# Patient Record
Sex: Female | Born: 1960 | Race: White | Hispanic: No | Marital: Married | Smoking: Never smoker
Health system: Southern US, Community
[De-identification: ages and names within clinical notes are randomized; demographics above are authoritative.]

## PROBLEM LIST (undated history)

## (undated) HISTORY — PX: LASER ABLATION/CAUTERIZATION OF ENDOMETRIAL IMPLANTS: SHX1951

---

## 2014-07-08 LAB — HM MAMMOGRAPHY

## 2014-10-28 LAB — HM PAP SMEAR: HM Pap smear: NEGATIVE

## 2015-09-16 DIAGNOSIS — Z1231 Encounter for screening mammogram for malignant neoplasm of breast: Secondary | ICD-10-CM | POA: Diagnosis not present

## 2015-10-07 DIAGNOSIS — K644 Residual hemorrhoidal skin tags: Secondary | ICD-10-CM | POA: Diagnosis not present

## 2015-10-07 DIAGNOSIS — D124 Benign neoplasm of descending colon: Secondary | ICD-10-CM | POA: Diagnosis not present

## 2015-10-07 DIAGNOSIS — D120 Benign neoplasm of cecum: Secondary | ICD-10-CM | POA: Diagnosis not present

## 2015-10-07 DIAGNOSIS — Z1211 Encounter for screening for malignant neoplasm of colon: Secondary | ICD-10-CM | POA: Diagnosis not present

## 2015-10-07 DIAGNOSIS — Z86010 Personal history of colonic polyps: Secondary | ICD-10-CM | POA: Diagnosis not present

## 2015-10-07 DIAGNOSIS — K5730 Diverticulosis of large intestine without perforation or abscess without bleeding: Secondary | ICD-10-CM | POA: Diagnosis not present

## 2016-04-26 ENCOUNTER — Ambulatory Visit (INDEPENDENT_AMBULATORY_CARE_PROVIDER_SITE_OTHER): Payer: BLUE CROSS/BLUE SHIELD | Admitting: Osteopathic Medicine

## 2016-04-26 ENCOUNTER — Encounter: Payer: Self-pay | Admitting: Osteopathic Medicine

## 2016-04-26 VITALS — BP 126/72 | HR 85 | Ht 68.0 in | Wt 173.0 lb

## 2016-04-26 DIAGNOSIS — R21 Rash and other nonspecific skin eruption: Secondary | ICD-10-CM

## 2016-04-26 DIAGNOSIS — Z0000 Encounter for general adult medical examination without abnormal findings: Secondary | ICD-10-CM | POA: Insufficient documentation

## 2016-04-26 DIAGNOSIS — E7410 Disorder of fructose metabolism, unspecified: Secondary | ICD-10-CM

## 2016-04-26 DIAGNOSIS — Z86010 Personal history of colonic polyps: Secondary | ICD-10-CM | POA: Insufficient documentation

## 2016-04-26 DIAGNOSIS — J302 Other seasonal allergic rhinitis: Secondary | ICD-10-CM | POA: Insufficient documentation

## 2016-04-26 DIAGNOSIS — Z23 Encounter for immunization: Secondary | ICD-10-CM | POA: Diagnosis not present

## 2016-04-26 DIAGNOSIS — B029 Zoster without complications: Secondary | ICD-10-CM

## 2016-04-26 MED ORDER — ZOSTER VACCINE LIVE 19400 UNT/0.65ML ~~LOC~~ SUSR: 0.6500 mL | Freq: Once | SUBCUTANEOUS | 0 refills | Status: AC

## 2016-04-26 NOTE — Progress Notes (Signed)
HPI: Lynn Gamble is a 55 y.o. female  who [facility] today, 04/26/16,  for chief complaint of:  Chief Complaint  Patient presents with  . Establish Care    rash on side of right breast    Rash . Context:  . Location: R breast on the side. Near bra line . Quality: started as small blisters, painful, nerve pain  . Duration: 2 weeks ago started, getting better  . Modifying factors: patches and antibiotic ointment   See below for review of preventive care.   Past medical, surgical, social and family history reviewed: There are no active problems to display for this patient.  No past surgical history on file. Social History  Substance Use Topics  . Smoking status: Never Smoker  . Smokeless tobacco: Never Used  . Alcohol use Not on file   No family history on file.   Current medication list and allergy/intolerance information reviewed:   Current Outpatient Prescriptions  Medication Sig Dispense Refill  . cetirizine (ZYRTEC) 10 MG tablet Take 10 mg by mouth daily.    . Multiple Vitamin (MULTIVITAMIN) tablet Take 1 tablet by mouth daily.     No current facility-administered medications for this visit.    Allergies  Allergen Reactions  . Fructose Diarrhea      Review of Systems:  Constitutional:  No  fever, no chills, No recent illness, No unintentional weight changes. No significant fatigue.   HEENT: No  headache, no vision change, no hearing change, No sore throat, No  sinus pressure  Cardiac: No  chest pain, No  pressure, No palpitations, No  Orthopnea  Respiratory:  No  shortness of breath. No  Cough  Gastrointestinal: No  abdominal pain, No  nausea, No  vomiting,  No  blood in stool, No  diarrhea, No  constipation   Musculoskeletal: No new myalgia/arthralgia  Genitourinary: No  incontinence, No  abnormal genital bleeding, No abnormal genital discharge  Skin: +Rash, No other wounds/concerning lesions  Hem/Onc:  No  easy bruising/bleeding, No  abnormal lymph node  Endocrine: No cold intolerance,  No heat intolerance. No polyuria/polydipsia/polyphagia   Neurologic: No  weakness, No  dizziness, No  slurred speech/focal weakness/facial droop  Psychiatric: No  concerns with depression, No  concerns with anxiety, No sleep problems, No mood problems  Exam:  BP 126/72   Pulse 85   Ht 5\' 8"  (1.727 m)   Wt 173 lb (78.5 kg)   BMI 26.30 kg/m   Constitutional: VS see above. General Appearance: alert, well-developed, well-nourished, NAD  Eyes: Normal lids and conjunctive, non-icteric sclera  Ears, Nose, Mouth, Throat: MMM, Normal external inspection ears/nares/mouth/lips/gums. TM normal bilaterally. Pharynx/tonsils no erythema, no exudate. Nasal mucosa normal.   Neck: No masses, trachea midline. No thyroid enlargement. No tenderness/mass appreciated. No lymphadenopathy  Respiratory: Normal respiratory effort. no wheeze, no rhonchi, no rales  Cardiovascular: S1/S2 normal, no murmur, no rub/gallop auscultated. RRR. No lower extremity edema.  Gastrointestinal: Nontender, no masses. No hepatomegaly, no splenomegaly. No hernia appreciated. Bowel sounds normal. Rectal exam deferred.   Musculoskeletal: Gait normal. No clubbing/cyanosis of digits.   Neurological: Normal balance/coordination. No tremor. No cranial nerve deficit on limited exam. Motor and sensation intact and symmetric. Cerebellar reflexes intact.   Skin: warm, dry, intact. Healing shingles type rash inferior lateral to right breast. No concerning nevi or subq nodules on limited exam.    Psychiatric: Normal judgment/insight. Normal mood and affect. Oriented x3.   BREAST: No  rashes/skin changes, normal fibrous breast tissue, no masses or tenderness, normal nipple without discharge, normal axilla    ASSESSMENT/PLAN:   Annual physical exam - Plan: CBC with Differential/Platelet, Lipid panel, COMPLETE METABOLIC PANEL WITH GFR, TSH, VITAMIN  D 25 Hydroxy (Vit-D Deficiency, Fractures), Zoster Vaccine Live, PF, (ZOSTAVAX) [id number] UNT/0.65ML injection  Need for tetanus booster  Fructose intolerance  Chronic seasonal allergic rhinitis, unspecified trigger  History of colonic polyps - colonoscopy 10/2015, awaiting records  Rash and nonspecific skin eruption  Herpes zoster without complication    FEMALE PREVENTIVE CARE Updated 04/26/16   ANNUAL SCREENING/COUNSELING  Diet/Exercise - HEALTHY HABITS DISCUSSED TO DECREASE CV RISK - fructose intolerance, admits to some noncompliance issues, previously following with GI, walks in treadmill few times per week History  Smoking Status  . Never Smoker  Smokeless Tobacco  . Never Used   History  Alcohol use Not on file   [facility] 04/[facility]/2016  Decreased Interest 0  Down, Depressed, Hopeless 0  PHQ - 2 Score 0    Domestic violence concerns - no  HTN SCREENING - SEE VITALS  SEXUAL HEALTH  Sexually active in the past year - Yes with female.  Need/want STI testing today? - no  Concerns about libido or pain with sex? - no  Plans for pregnancy? - postmenopusal  INFECTIOUS DISEASE SCREENING  HIV - does not need  GC/CT - does not need  HepC - DOB [birth year] - does not need  TB - does not need  DISEASE SCREENING  Lipid - needs  DM2 - needs  Osteoporosis - women age 79+ - does not need  CANCER SCREENING  Cervical - does not need, OBGYN visit 10/2015, hx abnormal pap x1, no procedures were needed   Breast - does not need, done this earlier this year 06/2015  Lung - does not need  Colon - does not need, recent colonoscopy 10/2015 HX precancerous polyps.   ADULT VACCINATION  Influenza - annual vaccine recommended, done thisyear  Td - booster every 10 years - done 04/26/16   Zoster - option at 4350, yes at 4460+ -   PCV13 - was not indicated  PPSV23 - was not indicated     Visit summary with medication list and pertinent instructions was  printed for patient to review. All questions at time of visit were answered - patient instructed to contact office with any additional concerns. ER/RTC precautions were reviewed with the patient. Follow-up plan: Return in about 1 year (around 04/26/2017) [facility] or sooner if needed.

## 2016-04-28 DIAGNOSIS — J302 Other seasonal allergic rhinitis: Secondary | ICD-10-CM | POA: Diagnosis not present

## 2016-04-28 DIAGNOSIS — Z23 Encounter for immunization: Secondary | ICD-10-CM | POA: Diagnosis not present

## 2016-04-28 LAB — LIPID PANEL
CHOL/HDL RATIO: 3.9 ratio (ref <5.0)
CHOLESTEROL: 193 mg/dL (ref <200)
HDL: 49 mg/dL — ABNORMAL LOW (ref >50)
LDL Cholesterol: 121 mg/dL — ABNORMAL HIGH (ref <100)
Triglycerides: 117 mg/dL (ref <150)
VLDL: 23 mg/dL (ref <30)

## 2016-04-28 LAB — CBC WITH DIFFERENTIAL/PLATELET
BASOS ABS: 0 {cells}/uL (ref 0–200)
Basophils Relative: 0 %
EOS ABS: 290 {cells}/uL (ref 15–500)
EOS PCT: 5 %
HCT: 41.4 % (ref 35.0–45.0)
Hemoglobin: 14.1 g/dL (ref 11.7–15.5)
Lymphocytes Relative: 31 %
Lymphs Abs: 1798 cells/uL (ref 850–3900)
MCH: 29.2 pg (ref 27.0–33.0)
MCHC: 34.1 g/dL (ref 32.0–36.0)
MCV: 85.7 fL (ref 80.0–100.0)
MONOS PCT: 8 %
MPV: 10.3 fL (ref 7.5–12.5)
Monocytes Absolute: 464 cells/uL (ref 200–950)
NEUTROS ABS: 3248 {cells}/uL (ref 1500–7800)
NEUTROS PCT: 56 %
PLATELETS: 293 10*3/uL (ref 140–400)
RBC: 4.83 MIL/uL (ref 3.80–5.10)
RDW: 13.5 % (ref 11.0–15.0)
WBC: 5.8 10*3/uL (ref 3.8–10.8)

## 2016-04-28 LAB — COMPLETE METABOLIC PANEL WITH GFR
ALBUMIN: 4.4 g/dL (ref 3.6–5.1)
ALT: 16 U/L (ref 6–29)
AST: 16 U/L (ref 10–35)
Alkaline Phosphatase: 72 U/L (ref 33–130)
BUN: 10 mg/dL (ref 7–25)
CHLORIDE: 104 mmol/L (ref 98–110)
CO2: 28 mmol/L (ref 20–31)
CREATININE: 0.79 mg/dL (ref 0.50–1.05)
Calcium: 9.7 mg/dL (ref 8.6–10.4)
GFR, Est African American: >89 mL/min (ref >=60)
GFR, Est Non African American: 86 mL/min (ref >=60)
GLUCOSE: 93 mg/dL (ref 65–99)
POTASSIUM: 4.2 mmol/L (ref 3.5–5.3)
SODIUM: 140 mmol/L (ref 135–146)
Total Bilirubin: 0.5 mg/dL (ref 0.2–1.2)
Total Protein: 7.2 g/dL (ref 6.1–8.1)

## 2016-04-28 LAB — TSH: TSH: 2.37 mIU/L

## 2016-04-28 NOTE — Addendum Note (Signed)
Addended by: Pixie CasinoUNNINGHAM, RHONDA C on: 04/28/2016 09:44 AM   Modules accepted: Orders

## 2016-04-29 LAB — VITAMIN D 25 HYDROXY (VIT D DEFICIENCY, FRACTURES): Vit D, 25-Hydroxy: 29 ng/mL — ABNORMAL LOW (ref 30–100)

## 2016-05-20 ENCOUNTER — Encounter: Payer: Self-pay | Admitting: Osteopathic Medicine

## 2016-07-26 ENCOUNTER — Telehealth: Payer: Self-pay

## 2016-07-26 NOTE — Telephone Encounter (Signed)
Dr. Selena Battenody pt's previous doctor called and stated that the pt has really bad allergies and is not feeling well. He states [place] Dr. Selena Battenody states that when she was under his care they were treating her allergies by giving her kenalog 40 mg injections every 3 to 4 months. Dr. Selena Battenody wants to know if that is something you could consider for her. Dr. Denyse Amassorey stated that you can reach him at [phone] or cell [phone]. Please advise?

## 2016-07-27 NOTE — Telephone Encounter (Signed)
Ok to continue injections if patient would like, ror we can refer to allergist - can come in for nurse visit for injection. Would be helpful to have records though if we are going to continue this treatment - can we please have Dr Selena Battenody send records, make sure pt has signed release? Thakns.

## 2016-07-27 NOTE — Telephone Encounter (Signed)
Pt informed. Pt is going to get Dr. Selena Battenody to fax her health records to the office. Pt stated she does want injections.

## 2016-08-04 ENCOUNTER — Telehealth: Payer: Self-pay | Admitting: Osteopathic Medicine

## 2016-08-04 NOTE — Telephone Encounter (Signed)
SPOKE TO PATIENT GAVE HER RESULTS AS NOTED BELOW. Lynn Gamble,CMA  

## 2016-08-04 NOTE — Telephone Encounter (Signed)
Please call patient: form filled out and left up front for her to pick up.

## 2016-09-23 ENCOUNTER — Telehealth: Payer: Self-pay

## 2016-09-23 NOTE — Telephone Encounter (Signed)
Patient called inquiring about records from previous provider about allergy shot. Patient was given direct fax number and advised to have them to fax me directly so that I can put it in the provider basket. Jeena Arnett,CMA

## 2016-10-24 DIAGNOSIS — Z1231 Encounter for screening mammogram for malignant neoplasm of breast: Secondary | ICD-10-CM | POA: Diagnosis not present

## 2016-10-24 LAB — HM MAMMOGRAPHY

## 2016-10-28 ENCOUNTER — Encounter: Payer: Self-pay | Admitting: Osteopathic Medicine

## 2017-03-15 ENCOUNTER — Ambulatory Visit (INDEPENDENT_AMBULATORY_CARE_PROVIDER_SITE_OTHER): Payer: BLUE CROSS/BLUE SHIELD | Admitting: Osteopathic Medicine

## 2017-03-15 DIAGNOSIS — Z23 Encounter for immunization: Secondary | ICD-10-CM

## 2017-05-02 ENCOUNTER — Ambulatory Visit (INDEPENDENT_AMBULATORY_CARE_PROVIDER_SITE_OTHER): Payer: BLUE CROSS/BLUE SHIELD | Admitting: Family Medicine

## 2017-05-02 ENCOUNTER — Encounter: Payer: Self-pay | Admitting: Family Medicine

## 2017-05-02 VITALS — BP 125/82 | HR 90 | Temp 98.2°F | Ht 68.0 in | Wt 164.0 lb

## 2017-05-02 DIAGNOSIS — J069 Acute upper respiratory infection, unspecified: Secondary | ICD-10-CM

## 2017-05-02 DIAGNOSIS — J040 Acute laryngitis: Secondary | ICD-10-CM | POA: Diagnosis not present

## 2017-05-02 MED ORDER — AZITHROMYCIN 250 MG PO TABS: ORAL_TABLET | ORAL | 0 refills | Status: AC

## 2017-05-02 NOTE — Progress Notes (Signed)
   Subjective:    Patient ID: Lynn PoleLinda Gamble, female    DOB: 06/18/1960, 56 y.o.   MRN: [id number]  HPI   56 year old female comes in complaining of upper respiratory symptoms.  She actually had a sinus head cold and headache about 2 weeks ago.  She gradually got better but then 2 days ago started experiencing sore throat and left ear pain and then woke up with a raspy hoarse voice and starting to cough.  She is mostly just been using Mucinex.     Review of Systems     Objective:   Physical Exam  Constitutional: She is oriented to person, place, and time. She appears well-developed and well-nourished.  HENT:  Head: Normocephalic and atraumatic.  Right Ear: External ear normal.  Left Ear: External ear normal.  Nose: Nose normal.  Mouth/Throat: Oropharynx is clear and moist.  TMs and canals are clear.   Eyes: Conjunctivae and EOM are normal. Pupils are equal, round, and reactive to light.  Neck: Neck supple. No thyromegaly present.  Cardiovascular: Normal rate, regular rhythm and normal heart sounds.  Pulmonary/Chest: Effort normal and breath sounds normal. She has no wheezes.  Lymphadenopathy:    She has no cervical adenopathy.  Neurological: She is alert and oriented to person, place, and time.  Skin: Skin is warm and dry.  Psychiatric: She has a normal mood and affect.        Assessment & Plan:  Upper respiratory infection with laryngitis-explained that is likely viral.  I do not think she has secondary sickening from the original illness I think this is just a separate viral illness.  But she is also getting ready to leave town this week to visit her mother-in-law who is on hospice and stay with them for about a week.  Thus I went ahead and gave her a prescription for azithromycin to fill if she is getting worse or if she is just not improving.  Recommend symptomatic treatment with Mucinex, ibuprofen for fever, salt water gargles and maybe even consider zinc lozenges which have  shown to shorten the duration of the illness.

## 2017-06-02 DIAGNOSIS — Z6825 Body mass index (BMI) 25.0-25.9, adult: Secondary | ICD-10-CM | POA: Diagnosis not present

## 2017-06-02 DIAGNOSIS — Z0000 Encounter for general adult medical examination without abnormal findings: Secondary | ICD-10-CM | POA: Diagnosis not present

## 2017-06-02 DIAGNOSIS — Z01419 Encounter for gynecological examination (general) (routine) without abnormal findings: Secondary | ICD-10-CM | POA: Diagnosis not present

## 2017-08-17 ENCOUNTER — Telehealth: Payer: Self-pay

## 2017-08-17 NOTE — Telephone Encounter (Signed)
Left VM for Pt to return clinic call. Callback information provided.

## 2017-08-17 NOTE — Telephone Encounter (Signed)
What was the reason she was getting kenalog? Were these joint injections? Needs visit anyway prior to any Rx, hasn't been seen in some time.

## 2017-08-17 NOTE — Telephone Encounter (Signed)
Pt called requesting if her previous medical records were rec'd by pcp. As per pt, she was receiving kenalog 40 mg injections. Requesting feed back from pcp whether she can get kenalog injections in the office. Thanks.

## 2017-08-22 NOTE — Telephone Encounter (Signed)
Per pt - as a treatment from her ENT provider, she was getting injections of Kenalog 40 mg for severe allergies/chronic rhinitis/sinusitis. Pt states that injections have improved these symptoms. If approved by pcp - she will make an appt for nurse visit to get Kenalog injection.

## 2017-08-30 NOTE — Telephone Encounter (Signed)
Left VM for Pt to return clinic call regarding recommendation, callback info provided.

## 2017-08-30 NOTE — Telephone Encounter (Signed)
Either way, if it's been more than a year since she has had an appoint with me, she really needs a follow-up for chronic medical issues as her allergies seem to require medical attention... Transfer patient to scheduling, I am not going to just order a nurse visit for allergy injections which are not first-line therapy without discussing further with the patient in a face-to-face visit

## 2017-10-06 DIAGNOSIS — H43812 Vitreous degeneration, left eye: Secondary | ICD-10-CM | POA: Diagnosis not present

## 2017-11-13 DIAGNOSIS — H43812 Vitreous degeneration, left eye: Secondary | ICD-10-CM | POA: Diagnosis not present

## 2018-01-18 DIAGNOSIS — Z1231 Encounter for screening mammogram for malignant neoplasm of breast: Secondary | ICD-10-CM | POA: Diagnosis not present

## 2018-01-18 LAB — HM MAMMOGRAPHY

## 2018-01-25 ENCOUNTER — Encounter: Payer: Self-pay | Admitting: Osteopathic Medicine

## 2018-01-25 ENCOUNTER — Telehealth: Payer: Self-pay | Admitting: Osteopathic Medicine

## 2018-01-25 NOTE — Telephone Encounter (Signed)
Pt advised of results, verbalized understanding. No further questions.  

## 2018-01-25 NOTE — Telephone Encounter (Signed)
Please call patient: I got her mammogram report.  There was no problem, plan to repeat in 1 year.

## 2018-03-15 DIAGNOSIS — Z23 Encounter for immunization: Secondary | ICD-10-CM | POA: Diagnosis not present

## 2018-05-09 DIAGNOSIS — R309 Painful micturition, unspecified: Secondary | ICD-10-CM | POA: Diagnosis not present

## 2018-05-09 DIAGNOSIS — R300 Dysuria: Secondary | ICD-10-CM | POA: Diagnosis not present

## 2018-06-04 DIAGNOSIS — M9905 Segmental and somatic dysfunction of pelvic region: Secondary | ICD-10-CM | POA: Diagnosis not present

## 2018-06-04 DIAGNOSIS — M25551 Pain in right hip: Secondary | ICD-10-CM | POA: Diagnosis not present

## 2018-06-04 DIAGNOSIS — M5416 Radiculopathy, lumbar region: Secondary | ICD-10-CM | POA: Diagnosis not present

## 2018-06-04 DIAGNOSIS — M9903 Segmental and somatic dysfunction of lumbar region: Secondary | ICD-10-CM | POA: Diagnosis not present

## 2018-06-05 DIAGNOSIS — M9903 Segmental and somatic dysfunction of lumbar region: Secondary | ICD-10-CM | POA: Diagnosis not present

## 2018-06-05 DIAGNOSIS — M9905 Segmental and somatic dysfunction of pelvic region: Secondary | ICD-10-CM | POA: Diagnosis not present

## 2018-06-05 DIAGNOSIS — M25551 Pain in right hip: Secondary | ICD-10-CM | POA: Diagnosis not present

## 2018-06-05 DIAGNOSIS — M5416 Radiculopathy, lumbar region: Secondary | ICD-10-CM | POA: Diagnosis not present

## 2018-06-06 DIAGNOSIS — M5416 Radiculopathy, lumbar region: Secondary | ICD-10-CM | POA: Diagnosis not present

## 2018-06-06 DIAGNOSIS — M9905 Segmental and somatic dysfunction of pelvic region: Secondary | ICD-10-CM | POA: Diagnosis not present

## 2018-06-06 DIAGNOSIS — M25551 Pain in right hip: Secondary | ICD-10-CM | POA: Diagnosis not present

## 2018-06-06 DIAGNOSIS — M9903 Segmental and somatic dysfunction of lumbar region: Secondary | ICD-10-CM | POA: Diagnosis not present

## 2018-06-08 DIAGNOSIS — M25551 Pain in right hip: Secondary | ICD-10-CM | POA: Diagnosis not present

## 2018-06-08 DIAGNOSIS — M5416 Radiculopathy, lumbar region: Secondary | ICD-10-CM | POA: Diagnosis not present

## 2018-06-08 DIAGNOSIS — M9903 Segmental and somatic dysfunction of lumbar region: Secondary | ICD-10-CM | POA: Diagnosis not present

## 2018-06-08 DIAGNOSIS — M9905 Segmental and somatic dysfunction of pelvic region: Secondary | ICD-10-CM | POA: Diagnosis not present

## 2018-06-09 DIAGNOSIS — M9902 Segmental and somatic dysfunction of thoracic region: Secondary | ICD-10-CM | POA: Diagnosis not present

## 2018-06-09 DIAGNOSIS — M461 Sacroiliitis, not elsewhere classified: Secondary | ICD-10-CM | POA: Diagnosis not present

## 2018-06-09 DIAGNOSIS — M9903 Segmental and somatic dysfunction of lumbar region: Secondary | ICD-10-CM | POA: Diagnosis not present

## 2018-06-09 DIAGNOSIS — M9905 Segmental and somatic dysfunction of pelvic region: Secondary | ICD-10-CM | POA: Diagnosis not present

## 2018-06-09 DIAGNOSIS — M5416 Radiculopathy, lumbar region: Secondary | ICD-10-CM | POA: Diagnosis not present

## 2018-06-09 DIAGNOSIS — M25551 Pain in right hip: Secondary | ICD-10-CM | POA: Diagnosis not present

## 2018-06-09 DIAGNOSIS — M9904 Segmental and somatic dysfunction of sacral region: Secondary | ICD-10-CM | POA: Diagnosis not present

## 2018-06-11 DIAGNOSIS — M5416 Radiculopathy, lumbar region: Secondary | ICD-10-CM | POA: Diagnosis not present

## 2018-06-11 DIAGNOSIS — M9903 Segmental and somatic dysfunction of lumbar region: Secondary | ICD-10-CM | POA: Diagnosis not present

## 2018-06-11 DIAGNOSIS — M25551 Pain in right hip: Secondary | ICD-10-CM | POA: Diagnosis not present

## 2018-06-11 DIAGNOSIS — M9905 Segmental and somatic dysfunction of pelvic region: Secondary | ICD-10-CM | POA: Diagnosis not present

## 2018-06-12 ENCOUNTER — Ambulatory Visit (INDEPENDENT_AMBULATORY_CARE_PROVIDER_SITE_OTHER): Payer: BLUE CROSS/BLUE SHIELD

## 2018-06-12 ENCOUNTER — Other Ambulatory Visit: Payer: Self-pay | Admitting: Chiropractic Medicine

## 2018-06-12 DIAGNOSIS — M545 Low back pain, unspecified: Secondary | ICD-10-CM

## 2018-06-12 DIAGNOSIS — M48061 Spinal stenosis, lumbar region without neurogenic claudication: Secondary | ICD-10-CM

## 2018-06-13 DIAGNOSIS — M25551 Pain in right hip: Secondary | ICD-10-CM | POA: Diagnosis not present

## 2018-06-13 DIAGNOSIS — M5416 Radiculopathy, lumbar region: Secondary | ICD-10-CM | POA: Diagnosis not present

## 2018-06-13 DIAGNOSIS — M9905 Segmental and somatic dysfunction of pelvic region: Secondary | ICD-10-CM | POA: Diagnosis not present

## 2018-06-13 DIAGNOSIS — M9903 Segmental and somatic dysfunction of lumbar region: Secondary | ICD-10-CM | POA: Diagnosis not present

## 2018-06-13 DIAGNOSIS — M545 Low back pain: Secondary | ICD-10-CM | POA: Diagnosis not present

## 2018-06-15 DIAGNOSIS — M9903 Segmental and somatic dysfunction of lumbar region: Secondary | ICD-10-CM | POA: Diagnosis not present

## 2018-06-15 DIAGNOSIS — M9905 Segmental and somatic dysfunction of pelvic region: Secondary | ICD-10-CM | POA: Diagnosis not present

## 2018-06-15 DIAGNOSIS — M5416 Radiculopathy, lumbar region: Secondary | ICD-10-CM | POA: Diagnosis not present

## 2018-06-15 DIAGNOSIS — M25551 Pain in right hip: Secondary | ICD-10-CM | POA: Diagnosis not present

## 2018-06-16 DIAGNOSIS — M25551 Pain in right hip: Secondary | ICD-10-CM | POA: Diagnosis not present

## 2018-06-16 DIAGNOSIS — M5416 Radiculopathy, lumbar region: Secondary | ICD-10-CM | POA: Diagnosis not present

## 2018-06-16 DIAGNOSIS — M9903 Segmental and somatic dysfunction of lumbar region: Secondary | ICD-10-CM | POA: Diagnosis not present

## 2018-06-16 DIAGNOSIS — M9905 Segmental and somatic dysfunction of pelvic region: Secondary | ICD-10-CM | POA: Diagnosis not present

## 2018-06-18 DIAGNOSIS — M5416 Radiculopathy, lumbar region: Secondary | ICD-10-CM | POA: Diagnosis not present

## 2018-06-18 DIAGNOSIS — M9905 Segmental and somatic dysfunction of pelvic region: Secondary | ICD-10-CM | POA: Diagnosis not present

## 2018-06-18 DIAGNOSIS — M9903 Segmental and somatic dysfunction of lumbar region: Secondary | ICD-10-CM | POA: Diagnosis not present

## 2018-06-18 DIAGNOSIS — M25551 Pain in right hip: Secondary | ICD-10-CM | POA: Diagnosis not present

## 2018-06-19 DIAGNOSIS — M25551 Pain in right hip: Secondary | ICD-10-CM | POA: Diagnosis not present

## 2018-06-19 DIAGNOSIS — M9905 Segmental and somatic dysfunction of pelvic region: Secondary | ICD-10-CM | POA: Diagnosis not present

## 2018-06-19 DIAGNOSIS — M5416 Radiculopathy, lumbar region: Secondary | ICD-10-CM | POA: Diagnosis not present

## 2018-06-19 DIAGNOSIS — M9903 Segmental and somatic dysfunction of lumbar region: Secondary | ICD-10-CM | POA: Diagnosis not present

## 2018-06-20 DIAGNOSIS — M25551 Pain in right hip: Secondary | ICD-10-CM | POA: Diagnosis not present

## 2018-06-20 DIAGNOSIS — M5416 Radiculopathy, lumbar region: Secondary | ICD-10-CM | POA: Diagnosis not present

## 2018-06-20 DIAGNOSIS — M9905 Segmental and somatic dysfunction of pelvic region: Secondary | ICD-10-CM | POA: Diagnosis not present

## 2018-06-20 DIAGNOSIS — M9903 Segmental and somatic dysfunction of lumbar region: Secondary | ICD-10-CM | POA: Diagnosis not present

## 2018-06-22 DIAGNOSIS — Z0000 Encounter for general adult medical examination without abnormal findings: Secondary | ICD-10-CM | POA: Diagnosis not present

## 2018-06-22 DIAGNOSIS — M9905 Segmental and somatic dysfunction of pelvic region: Secondary | ICD-10-CM | POA: Diagnosis not present

## 2018-06-22 DIAGNOSIS — M5416 Radiculopathy, lumbar region: Secondary | ICD-10-CM | POA: Diagnosis not present

## 2018-06-22 DIAGNOSIS — M25551 Pain in right hip: Secondary | ICD-10-CM | POA: Diagnosis not present

## 2018-06-22 DIAGNOSIS — M9903 Segmental and somatic dysfunction of lumbar region: Secondary | ICD-10-CM | POA: Diagnosis not present

## 2018-06-22 DIAGNOSIS — Z6826 Body mass index (BMI) 26.0-26.9, adult: Secondary | ICD-10-CM | POA: Diagnosis not present

## 2018-06-22 DIAGNOSIS — Z01419 Encounter for gynecological examination (general) (routine) without abnormal findings: Secondary | ICD-10-CM | POA: Diagnosis not present

## 2018-06-25 DIAGNOSIS — M5416 Radiculopathy, lumbar region: Secondary | ICD-10-CM | POA: Diagnosis not present

## 2018-06-25 DIAGNOSIS — M9903 Segmental and somatic dysfunction of lumbar region: Secondary | ICD-10-CM | POA: Diagnosis not present

## 2018-06-25 DIAGNOSIS — M25551 Pain in right hip: Secondary | ICD-10-CM | POA: Diagnosis not present

## 2018-06-25 DIAGNOSIS — M9905 Segmental and somatic dysfunction of pelvic region: Secondary | ICD-10-CM | POA: Diagnosis not present

## 2018-06-27 DIAGNOSIS — M461 Sacroiliitis, not elsewhere classified: Secondary | ICD-10-CM | POA: Diagnosis not present

## 2018-06-27 DIAGNOSIS — M9902 Segmental and somatic dysfunction of thoracic region: Secondary | ICD-10-CM | POA: Diagnosis not present

## 2018-06-27 DIAGNOSIS — M25551 Pain in right hip: Secondary | ICD-10-CM | POA: Diagnosis not present

## 2018-06-27 DIAGNOSIS — M9905 Segmental and somatic dysfunction of pelvic region: Secondary | ICD-10-CM | POA: Diagnosis not present

## 2018-06-27 DIAGNOSIS — M9904 Segmental and somatic dysfunction of sacral region: Secondary | ICD-10-CM | POA: Diagnosis not present

## 2018-06-27 DIAGNOSIS — M545 Low back pain: Secondary | ICD-10-CM | POA: Diagnosis not present

## 2018-06-27 DIAGNOSIS — M5416 Radiculopathy, lumbar region: Secondary | ICD-10-CM | POA: Diagnosis not present

## 2018-06-27 DIAGNOSIS — M9903 Segmental and somatic dysfunction of lumbar region: Secondary | ICD-10-CM | POA: Diagnosis not present

## 2018-06-29 DIAGNOSIS — M5416 Radiculopathy, lumbar region: Secondary | ICD-10-CM | POA: Diagnosis not present

## 2018-06-29 DIAGNOSIS — M25551 Pain in right hip: Secondary | ICD-10-CM | POA: Diagnosis not present

## 2018-06-29 DIAGNOSIS — M9904 Segmental and somatic dysfunction of sacral region: Secondary | ICD-10-CM | POA: Diagnosis not present

## 2018-06-29 DIAGNOSIS — M461 Sacroiliitis, not elsewhere classified: Secondary | ICD-10-CM | POA: Diagnosis not present

## 2018-06-29 DIAGNOSIS — M9903 Segmental and somatic dysfunction of lumbar region: Secondary | ICD-10-CM | POA: Diagnosis not present

## 2018-06-29 DIAGNOSIS — M9902 Segmental and somatic dysfunction of thoracic region: Secondary | ICD-10-CM | POA: Diagnosis not present

## 2018-06-29 DIAGNOSIS — M9905 Segmental and somatic dysfunction of pelvic region: Secondary | ICD-10-CM | POA: Diagnosis not present

## 2018-07-03 ENCOUNTER — Telehealth: Payer: Self-pay | Admitting: Osteopathic Medicine

## 2018-07-03 NOTE — Telephone Encounter (Signed)
Pt has been updated and was not happy with provider's note or recommendation. As per pt, will seek care elsewhere. At provider's request - can be removed as PCP.

## 2018-07-03 NOTE — Telephone Encounter (Signed)
Happy to refer to allergy clinic but frequent steroid use is not appropriate

## 2018-07-03 NOTE — Telephone Encounter (Signed)
Routing to PCP

## 2018-07-03 NOTE — Telephone Encounter (Signed)
Reviewed encounter note and yes, the correct medication is. Kenalog.  Thank you.

## 2018-07-03 NOTE — Telephone Encounter (Signed)
Pt called. She is needing to schedule an appointment to get  Atenolol shot for her allergies. She said that she was getting it previously when she was in [place]. She says that she believes [place] told her that we had that office note from previous Dr where she got allergy shot..I don't see any note on file.   Please advise. Thanks

## 2018-07-03 NOTE — Telephone Encounter (Signed)
Pls review telephone encounter note from 08/22/2017. The correct medication was for Kenalog. Pt was overdue for an appt & was left a vm msg to make an appt to be evaluated. Per provider, requested med is not the first line of treatment for allergies/chronic rhinitis/sinusitus issues. Forwarding to provider for review. Thanks.

## 2018-07-06 DIAGNOSIS — M9902 Segmental and somatic dysfunction of thoracic region: Secondary | ICD-10-CM | POA: Diagnosis not present

## 2018-07-06 DIAGNOSIS — M25551 Pain in right hip: Secondary | ICD-10-CM | POA: Diagnosis not present

## 2018-07-06 DIAGNOSIS — M9904 Segmental and somatic dysfunction of sacral region: Secondary | ICD-10-CM | POA: Diagnosis not present

## 2018-07-06 DIAGNOSIS — M9905 Segmental and somatic dysfunction of pelvic region: Secondary | ICD-10-CM | POA: Diagnosis not present

## 2018-07-06 DIAGNOSIS — M546 Pain in thoracic spine: Secondary | ICD-10-CM | POA: Diagnosis not present

## 2018-07-06 DIAGNOSIS — M461 Sacroiliitis, not elsewhere classified: Secondary | ICD-10-CM | POA: Diagnosis not present

## 2018-07-06 DIAGNOSIS — M9903 Segmental and somatic dysfunction of lumbar region: Secondary | ICD-10-CM | POA: Diagnosis not present

## 2018-07-06 DIAGNOSIS — M5416 Radiculopathy, lumbar region: Secondary | ICD-10-CM | POA: Diagnosis not present

## 2018-07-09 DIAGNOSIS — M545 Low back pain: Secondary | ICD-10-CM | POA: Diagnosis not present

## 2018-07-09 DIAGNOSIS — R6889 Other general symptoms and signs: Secondary | ICD-10-CM | POA: Diagnosis not present

## 2018-07-16 DIAGNOSIS — M545 Low back pain: Secondary | ICD-10-CM | POA: Diagnosis not present

## 2018-07-16 DIAGNOSIS — R6889 Other general symptoms and signs: Secondary | ICD-10-CM | POA: Diagnosis not present

## 2018-07-18 DIAGNOSIS — M5416 Radiculopathy, lumbar region: Secondary | ICD-10-CM | POA: Diagnosis not present

## 2018-07-18 DIAGNOSIS — M9903 Segmental and somatic dysfunction of lumbar region: Secondary | ICD-10-CM | POA: Diagnosis not present

## 2018-07-18 DIAGNOSIS — M25551 Pain in right hip: Secondary | ICD-10-CM | POA: Diagnosis not present

## 2018-07-18 DIAGNOSIS — M9905 Segmental and somatic dysfunction of pelvic region: Secondary | ICD-10-CM | POA: Diagnosis not present

## 2018-07-23 DIAGNOSIS — R6889 Other general symptoms and signs: Secondary | ICD-10-CM | POA: Diagnosis not present

## 2018-07-23 DIAGNOSIS — M545 Low back pain: Secondary | ICD-10-CM | POA: Diagnosis not present

## 2018-07-25 DIAGNOSIS — M545 Low back pain: Secondary | ICD-10-CM | POA: Diagnosis not present

## 2018-07-27 DIAGNOSIS — M25551 Pain in right hip: Secondary | ICD-10-CM | POA: Diagnosis not present

## 2018-07-27 DIAGNOSIS — M9903 Segmental and somatic dysfunction of lumbar region: Secondary | ICD-10-CM | POA: Diagnosis not present

## 2018-07-27 DIAGNOSIS — M545 Low back pain: Secondary | ICD-10-CM | POA: Diagnosis not present

## 2018-07-27 DIAGNOSIS — M9905 Segmental and somatic dysfunction of pelvic region: Secondary | ICD-10-CM | POA: Diagnosis not present

## 2018-08-06 DIAGNOSIS — M545 Low back pain: Secondary | ICD-10-CM | POA: Diagnosis not present

## 2018-08-06 DIAGNOSIS — M25551 Pain in right hip: Secondary | ICD-10-CM | POA: Diagnosis not present

## 2018-08-06 DIAGNOSIS — M9905 Segmental and somatic dysfunction of pelvic region: Secondary | ICD-10-CM | POA: Diagnosis not present

## 2018-08-06 DIAGNOSIS — M9903 Segmental and somatic dysfunction of lumbar region: Secondary | ICD-10-CM | POA: Diagnosis not present

## 2018-08-27 DIAGNOSIS — M545 Low back pain: Secondary | ICD-10-CM | POA: Diagnosis not present

## 2018-08-27 DIAGNOSIS — M9905 Segmental and somatic dysfunction of pelvic region: Secondary | ICD-10-CM | POA: Diagnosis not present

## 2018-08-27 DIAGNOSIS — M25551 Pain in right hip: Secondary | ICD-10-CM | POA: Diagnosis not present

## 2018-08-27 DIAGNOSIS — M9903 Segmental and somatic dysfunction of lumbar region: Secondary | ICD-10-CM | POA: Diagnosis not present

## 2018-08-30 DIAGNOSIS — Z01419 Encounter for gynecological examination (general) (routine) without abnormal findings: Secondary | ICD-10-CM | POA: Diagnosis not present

## 2018-09-17 DIAGNOSIS — M25551 Pain in right hip: Secondary | ICD-10-CM | POA: Diagnosis not present

## 2018-09-17 DIAGNOSIS — M9903 Segmental and somatic dysfunction of lumbar region: Secondary | ICD-10-CM | POA: Diagnosis not present

## 2018-09-17 DIAGNOSIS — M9905 Segmental and somatic dysfunction of pelvic region: Secondary | ICD-10-CM | POA: Diagnosis not present

## 2018-09-17 DIAGNOSIS — M545 Low back pain: Secondary | ICD-10-CM | POA: Diagnosis not present

## 2018-10-10 DIAGNOSIS — M545 Low back pain: Secondary | ICD-10-CM | POA: Diagnosis not present

## 2018-10-10 DIAGNOSIS — M25551 Pain in right hip: Secondary | ICD-10-CM | POA: Diagnosis not present

## 2018-10-10 DIAGNOSIS — M9903 Segmental and somatic dysfunction of lumbar region: Secondary | ICD-10-CM | POA: Diagnosis not present

## 2018-10-10 DIAGNOSIS — M9905 Segmental and somatic dysfunction of pelvic region: Secondary | ICD-10-CM | POA: Diagnosis not present

## 2018-11-07 DIAGNOSIS — M9904 Segmental and somatic dysfunction of sacral region: Secondary | ICD-10-CM | POA: Diagnosis not present

## 2018-11-07 DIAGNOSIS — M9905 Segmental and somatic dysfunction of pelvic region: Secondary | ICD-10-CM | POA: Diagnosis not present

## 2018-11-07 DIAGNOSIS — M9903 Segmental and somatic dysfunction of lumbar region: Secondary | ICD-10-CM | POA: Diagnosis not present

## 2018-11-07 DIAGNOSIS — M9902 Segmental and somatic dysfunction of thoracic region: Secondary | ICD-10-CM | POA: Diagnosis not present

## 2018-11-07 DIAGNOSIS — M546 Pain in thoracic spine: Secondary | ICD-10-CM | POA: Diagnosis not present

## 2018-11-07 DIAGNOSIS — M25551 Pain in right hip: Secondary | ICD-10-CM | POA: Diagnosis not present

## 2018-12-04 DIAGNOSIS — M545 Low back pain: Secondary | ICD-10-CM | POA: Diagnosis not present

## 2018-12-04 DIAGNOSIS — M9904 Segmental and somatic dysfunction of sacral region: Secondary | ICD-10-CM | POA: Diagnosis not present

## 2018-12-04 DIAGNOSIS — M9902 Segmental and somatic dysfunction of thoracic region: Secondary | ICD-10-CM | POA: Diagnosis not present

## 2018-12-04 DIAGNOSIS — M9903 Segmental and somatic dysfunction of lumbar region: Secondary | ICD-10-CM | POA: Diagnosis not present

## 2018-12-04 DIAGNOSIS — M25551 Pain in right hip: Secondary | ICD-10-CM | POA: Diagnosis not present

## 2018-12-04 DIAGNOSIS — M9905 Segmental and somatic dysfunction of pelvic region: Secondary | ICD-10-CM | POA: Diagnosis not present

## 2019-01-03 DIAGNOSIS — Z86010 Personal history of colonic polyps: Secondary | ICD-10-CM | POA: Diagnosis not present

## 2019-01-29 DIAGNOSIS — M6019 Interstitial myositis, multiple sites: Secondary | ICD-10-CM | POA: Diagnosis not present

## 2019-01-29 DIAGNOSIS — M9903 Segmental and somatic dysfunction of lumbar region: Secondary | ICD-10-CM | POA: Diagnosis not present

## 2019-01-29 DIAGNOSIS — M9902 Segmental and somatic dysfunction of thoracic region: Secondary | ICD-10-CM | POA: Diagnosis not present

## 2019-01-29 DIAGNOSIS — M9904 Segmental and somatic dysfunction of sacral region: Secondary | ICD-10-CM | POA: Diagnosis not present

## 2019-01-29 DIAGNOSIS — H903 Sensorineural hearing loss, bilateral: Secondary | ICD-10-CM | POA: Diagnosis not present

## 2019-01-29 DIAGNOSIS — M25551 Pain in right hip: Secondary | ICD-10-CM | POA: Diagnosis not present

## 2019-01-29 DIAGNOSIS — M545 Low back pain: Secondary | ICD-10-CM | POA: Diagnosis not present

## 2019-01-29 DIAGNOSIS — M546 Pain in thoracic spine: Secondary | ICD-10-CM | POA: Diagnosis not present

## 2019-01-29 DIAGNOSIS — M9905 Segmental and somatic dysfunction of pelvic region: Secondary | ICD-10-CM | POA: Diagnosis not present

## 2019-02-26 DIAGNOSIS — M9902 Segmental and somatic dysfunction of thoracic region: Secondary | ICD-10-CM | POA: Diagnosis not present

## 2019-02-26 DIAGNOSIS — M9904 Segmental and somatic dysfunction of sacral region: Secondary | ICD-10-CM | POA: Diagnosis not present

## 2019-02-26 DIAGNOSIS — M25551 Pain in right hip: Secondary | ICD-10-CM | POA: Diagnosis not present

## 2019-02-26 DIAGNOSIS — M9903 Segmental and somatic dysfunction of lumbar region: Secondary | ICD-10-CM | POA: Diagnosis not present

## 2019-02-26 DIAGNOSIS — M546 Pain in thoracic spine: Secondary | ICD-10-CM | POA: Diagnosis not present

## 2019-02-26 DIAGNOSIS — M9905 Segmental and somatic dysfunction of pelvic region: Secondary | ICD-10-CM | POA: Diagnosis not present

## 2019-02-26 DIAGNOSIS — M6019 Interstitial myositis, multiple sites: Secondary | ICD-10-CM | POA: Diagnosis not present

## 2019-02-26 DIAGNOSIS — M545 Low back pain: Secondary | ICD-10-CM | POA: Diagnosis not present

## 2019-03-05 DIAGNOSIS — Z1231 Encounter for screening mammogram for malignant neoplasm of breast: Secondary | ICD-10-CM | POA: Diagnosis not present

## 2019-03-13 DIAGNOSIS — Z23 Encounter for immunization: Secondary | ICD-10-CM | POA: Diagnosis not present

## 2019-04-03 DIAGNOSIS — M6019 Interstitial myositis, multiple sites: Secondary | ICD-10-CM | POA: Diagnosis not present

## 2019-04-03 DIAGNOSIS — M9903 Segmental and somatic dysfunction of lumbar region: Secondary | ICD-10-CM | POA: Diagnosis not present

## 2019-04-03 DIAGNOSIS — M9902 Segmental and somatic dysfunction of thoracic region: Secondary | ICD-10-CM | POA: Diagnosis not present

## 2019-04-03 DIAGNOSIS — M546 Pain in thoracic spine: Secondary | ICD-10-CM | POA: Diagnosis not present

## 2019-04-03 DIAGNOSIS — M9904 Segmental and somatic dysfunction of sacral region: Secondary | ICD-10-CM | POA: Diagnosis not present

## 2019-04-03 DIAGNOSIS — M25551 Pain in right hip: Secondary | ICD-10-CM | POA: Diagnosis not present

## 2019-04-03 DIAGNOSIS — M9905 Segmental and somatic dysfunction of pelvic region: Secondary | ICD-10-CM | POA: Diagnosis not present

## 2019-04-03 DIAGNOSIS — M545 Low back pain: Secondary | ICD-10-CM | POA: Diagnosis not present

## 2019-05-07 DIAGNOSIS — M6019 Interstitial myositis, multiple sites: Secondary | ICD-10-CM | POA: Diagnosis not present

## 2019-05-07 DIAGNOSIS — M25551 Pain in right hip: Secondary | ICD-10-CM | POA: Diagnosis not present

## 2019-05-07 DIAGNOSIS — M545 Low back pain: Secondary | ICD-10-CM | POA: Diagnosis not present

## 2019-05-07 DIAGNOSIS — M9903 Segmental and somatic dysfunction of lumbar region: Secondary | ICD-10-CM | POA: Diagnosis not present

## 2019-05-07 DIAGNOSIS — M9902 Segmental and somatic dysfunction of thoracic region: Secondary | ICD-10-CM | POA: Diagnosis not present

## 2019-05-07 DIAGNOSIS — M546 Pain in thoracic spine: Secondary | ICD-10-CM | POA: Diagnosis not present

## 2019-05-07 DIAGNOSIS — M9905 Segmental and somatic dysfunction of pelvic region: Secondary | ICD-10-CM | POA: Diagnosis not present

## 2019-05-07 DIAGNOSIS — M9904 Segmental and somatic dysfunction of sacral region: Secondary | ICD-10-CM | POA: Diagnosis not present

## 2019-06-21 DIAGNOSIS — M9905 Segmental and somatic dysfunction of pelvic region: Secondary | ICD-10-CM | POA: Diagnosis not present

## 2019-06-21 DIAGNOSIS — M25551 Pain in right hip: Secondary | ICD-10-CM | POA: Diagnosis not present

## 2019-06-21 DIAGNOSIS — M545 Low back pain: Secondary | ICD-10-CM | POA: Diagnosis not present

## 2019-06-21 DIAGNOSIS — M9903 Segmental and somatic dysfunction of lumbar region: Secondary | ICD-10-CM | POA: Diagnosis not present

## 2019-07-19 DIAGNOSIS — Z6826 Body mass index (BMI) 26.0-26.9, adult: Secondary | ICD-10-CM | POA: Diagnosis not present

## 2019-07-19 DIAGNOSIS — Z0000 Encounter for general adult medical examination without abnormal findings: Secondary | ICD-10-CM | POA: Diagnosis not present

## 2019-07-19 DIAGNOSIS — Z13220 Encounter for screening for lipoid disorders: Secondary | ICD-10-CM | POA: Diagnosis not present

## 2019-07-19 DIAGNOSIS — Z01419 Encounter for gynecological examination (general) (routine) without abnormal findings: Secondary | ICD-10-CM | POA: Diagnosis not present

## 2019-07-22 DIAGNOSIS — M545 Low back pain: Secondary | ICD-10-CM | POA: Diagnosis not present

## 2019-07-22 DIAGNOSIS — M9903 Segmental and somatic dysfunction of lumbar region: Secondary | ICD-10-CM | POA: Diagnosis not present

## 2019-07-22 DIAGNOSIS — M25551 Pain in right hip: Secondary | ICD-10-CM | POA: Diagnosis not present

## 2019-07-22 DIAGNOSIS — M9905 Segmental and somatic dysfunction of pelvic region: Secondary | ICD-10-CM | POA: Diagnosis not present

## 2019-08-05 DIAGNOSIS — M25551 Pain in right hip: Secondary | ICD-10-CM | POA: Diagnosis not present

## 2019-08-05 DIAGNOSIS — M545 Low back pain: Secondary | ICD-10-CM | POA: Diagnosis not present

## 2019-08-05 DIAGNOSIS — M9903 Segmental and somatic dysfunction of lumbar region: Secondary | ICD-10-CM | POA: Diagnosis not present

## 2019-08-05 DIAGNOSIS — M9905 Segmental and somatic dysfunction of pelvic region: Secondary | ICD-10-CM | POA: Diagnosis not present

## 2019-08-07 DIAGNOSIS — M5417 Radiculopathy, lumbosacral region: Secondary | ICD-10-CM | POA: Diagnosis not present

## 2019-08-07 DIAGNOSIS — M5431 Sciatica, right side: Secondary | ICD-10-CM | POA: Diagnosis not present

## 2019-08-07 DIAGNOSIS — M9905 Segmental and somatic dysfunction of pelvic region: Secondary | ICD-10-CM | POA: Diagnosis not present

## 2019-08-07 DIAGNOSIS — M9903 Segmental and somatic dysfunction of lumbar region: Secondary | ICD-10-CM | POA: Diagnosis not present

## 2019-08-09 DIAGNOSIS — M5431 Sciatica, right side: Secondary | ICD-10-CM | POA: Diagnosis not present

## 2019-08-09 DIAGNOSIS — M9905 Segmental and somatic dysfunction of pelvic region: Secondary | ICD-10-CM | POA: Diagnosis not present

## 2019-08-09 DIAGNOSIS — M9903 Segmental and somatic dysfunction of lumbar region: Secondary | ICD-10-CM | POA: Diagnosis not present

## 2019-08-09 DIAGNOSIS — M5417 Radiculopathy, lumbosacral region: Secondary | ICD-10-CM | POA: Diagnosis not present

## 2019-08-12 DIAGNOSIS — M5417 Radiculopathy, lumbosacral region: Secondary | ICD-10-CM | POA: Diagnosis not present

## 2019-08-12 DIAGNOSIS — M9903 Segmental and somatic dysfunction of lumbar region: Secondary | ICD-10-CM | POA: Diagnosis not present

## 2019-08-12 DIAGNOSIS — M9905 Segmental and somatic dysfunction of pelvic region: Secondary | ICD-10-CM | POA: Diagnosis not present

## 2019-08-12 DIAGNOSIS — M5431 Sciatica, right side: Secondary | ICD-10-CM | POA: Diagnosis not present

## 2019-08-14 DIAGNOSIS — M9903 Segmental and somatic dysfunction of lumbar region: Secondary | ICD-10-CM | POA: Diagnosis not present

## 2019-08-14 DIAGNOSIS — M5431 Sciatica, right side: Secondary | ICD-10-CM | POA: Diagnosis not present

## 2019-08-14 DIAGNOSIS — M9905 Segmental and somatic dysfunction of pelvic region: Secondary | ICD-10-CM | POA: Diagnosis not present

## 2019-08-14 DIAGNOSIS — M5417 Radiculopathy, lumbosacral region: Secondary | ICD-10-CM | POA: Diagnosis not present

## 2019-08-16 DIAGNOSIS — M9903 Segmental and somatic dysfunction of lumbar region: Secondary | ICD-10-CM | POA: Diagnosis not present

## 2019-08-16 DIAGNOSIS — M5431 Sciatica, right side: Secondary | ICD-10-CM | POA: Diagnosis not present

## 2019-08-16 DIAGNOSIS — M9905 Segmental and somatic dysfunction of pelvic region: Secondary | ICD-10-CM | POA: Diagnosis not present

## 2019-08-16 DIAGNOSIS — M5417 Radiculopathy, lumbosacral region: Secondary | ICD-10-CM | POA: Diagnosis not present

## 2019-08-17 DIAGNOSIS — M5417 Radiculopathy, lumbosacral region: Secondary | ICD-10-CM | POA: Diagnosis not present

## 2019-08-17 DIAGNOSIS — M5431 Sciatica, right side: Secondary | ICD-10-CM | POA: Diagnosis not present

## 2019-08-17 DIAGNOSIS — M9903 Segmental and somatic dysfunction of lumbar region: Secondary | ICD-10-CM | POA: Diagnosis not present

## 2019-08-17 DIAGNOSIS — M9905 Segmental and somatic dysfunction of pelvic region: Secondary | ICD-10-CM | POA: Diagnosis not present

## 2019-08-19 DIAGNOSIS — M9903 Segmental and somatic dysfunction of lumbar region: Secondary | ICD-10-CM | POA: Diagnosis not present

## 2019-08-19 DIAGNOSIS — M9905 Segmental and somatic dysfunction of pelvic region: Secondary | ICD-10-CM | POA: Diagnosis not present

## 2019-08-19 DIAGNOSIS — M5431 Sciatica, right side: Secondary | ICD-10-CM | POA: Diagnosis not present

## 2019-08-19 DIAGNOSIS — M5417 Radiculopathy, lumbosacral region: Secondary | ICD-10-CM | POA: Diagnosis not present

## 2019-08-22 DIAGNOSIS — M5417 Radiculopathy, lumbosacral region: Secondary | ICD-10-CM | POA: Diagnosis not present

## 2019-08-22 DIAGNOSIS — M5431 Sciatica, right side: Secondary | ICD-10-CM | POA: Diagnosis not present

## 2019-08-22 DIAGNOSIS — M9903 Segmental and somatic dysfunction of lumbar region: Secondary | ICD-10-CM | POA: Diagnosis not present

## 2019-08-22 DIAGNOSIS — M9905 Segmental and somatic dysfunction of pelvic region: Secondary | ICD-10-CM | POA: Diagnosis not present

## 2019-08-27 DIAGNOSIS — M9903 Segmental and somatic dysfunction of lumbar region: Secondary | ICD-10-CM | POA: Diagnosis not present

## 2019-08-27 DIAGNOSIS — M9905 Segmental and somatic dysfunction of pelvic region: Secondary | ICD-10-CM | POA: Diagnosis not present

## 2019-08-27 DIAGNOSIS — M5417 Radiculopathy, lumbosacral region: Secondary | ICD-10-CM | POA: Diagnosis not present

## 2019-08-27 DIAGNOSIS — M5431 Sciatica, right side: Secondary | ICD-10-CM | POA: Diagnosis not present

## 2019-08-29 DIAGNOSIS — M5417 Radiculopathy, lumbosacral region: Secondary | ICD-10-CM | POA: Diagnosis not present

## 2019-08-29 DIAGNOSIS — M9903 Segmental and somatic dysfunction of lumbar region: Secondary | ICD-10-CM | POA: Diagnosis not present

## 2019-08-29 DIAGNOSIS — M9905 Segmental and somatic dysfunction of pelvic region: Secondary | ICD-10-CM | POA: Diagnosis not present

## 2019-08-29 DIAGNOSIS — M5431 Sciatica, right side: Secondary | ICD-10-CM | POA: Diagnosis not present

## 2019-09-02 DIAGNOSIS — M9903 Segmental and somatic dysfunction of lumbar region: Secondary | ICD-10-CM | POA: Diagnosis not present

## 2019-09-02 DIAGNOSIS — M5417 Radiculopathy, lumbosacral region: Secondary | ICD-10-CM | POA: Diagnosis not present

## 2019-09-02 DIAGNOSIS — M9905 Segmental and somatic dysfunction of pelvic region: Secondary | ICD-10-CM | POA: Diagnosis not present

## 2019-09-02 DIAGNOSIS — M5431 Sciatica, right side: Secondary | ICD-10-CM | POA: Diagnosis not present

## 2019-09-06 DIAGNOSIS — M5431 Sciatica, right side: Secondary | ICD-10-CM | POA: Diagnosis not present

## 2019-09-06 DIAGNOSIS — M9903 Segmental and somatic dysfunction of lumbar region: Secondary | ICD-10-CM | POA: Diagnosis not present

## 2019-09-06 DIAGNOSIS — M5417 Radiculopathy, lumbosacral region: Secondary | ICD-10-CM | POA: Diagnosis not present

## 2019-09-06 DIAGNOSIS — M9905 Segmental and somatic dysfunction of pelvic region: Secondary | ICD-10-CM | POA: Diagnosis not present

## 2019-09-11 DIAGNOSIS — M5431 Sciatica, right side: Secondary | ICD-10-CM | POA: Diagnosis not present

## 2019-09-11 DIAGNOSIS — M9903 Segmental and somatic dysfunction of lumbar region: Secondary | ICD-10-CM | POA: Diagnosis not present

## 2019-09-11 DIAGNOSIS — M9905 Segmental and somatic dysfunction of pelvic region: Secondary | ICD-10-CM | POA: Diagnosis not present

## 2019-09-11 DIAGNOSIS — M5417 Radiculopathy, lumbosacral region: Secondary | ICD-10-CM | POA: Diagnosis not present

## 2019-09-17 DIAGNOSIS — M5431 Sciatica, right side: Secondary | ICD-10-CM | POA: Diagnosis not present

## 2019-09-17 DIAGNOSIS — Z13220 Encounter for screening for lipoid disorders: Secondary | ICD-10-CM | POA: Diagnosis not present

## 2019-09-17 DIAGNOSIS — M9905 Segmental and somatic dysfunction of pelvic region: Secondary | ICD-10-CM | POA: Diagnosis not present

## 2019-09-17 DIAGNOSIS — Z1329 Encounter for screening for other suspected endocrine disorder: Secondary | ICD-10-CM | POA: Diagnosis not present

## 2019-09-17 DIAGNOSIS — M9903 Segmental and somatic dysfunction of lumbar region: Secondary | ICD-10-CM | POA: Diagnosis not present

## 2019-09-17 DIAGNOSIS — M5417 Radiculopathy, lumbosacral region: Secondary | ICD-10-CM | POA: Diagnosis not present

## 2019-09-17 DIAGNOSIS — Z0000 Encounter for general adult medical examination without abnormal findings: Secondary | ICD-10-CM | POA: Diagnosis not present

## 2019-09-25 DIAGNOSIS — M9905 Segmental and somatic dysfunction of pelvic region: Secondary | ICD-10-CM | POA: Diagnosis not present

## 2019-09-25 DIAGNOSIS — M5417 Radiculopathy, lumbosacral region: Secondary | ICD-10-CM | POA: Diagnosis not present

## 2019-09-25 DIAGNOSIS — M5431 Sciatica, right side: Secondary | ICD-10-CM | POA: Diagnosis not present

## 2019-09-25 DIAGNOSIS — M9903 Segmental and somatic dysfunction of lumbar region: Secondary | ICD-10-CM | POA: Diagnosis not present

## 2019-10-04 DIAGNOSIS — M5417 Radiculopathy, lumbosacral region: Secondary | ICD-10-CM | POA: Diagnosis not present

## 2019-10-04 DIAGNOSIS — M5431 Sciatica, right side: Secondary | ICD-10-CM | POA: Diagnosis not present

## 2019-10-04 DIAGNOSIS — M9905 Segmental and somatic dysfunction of pelvic region: Secondary | ICD-10-CM | POA: Diagnosis not present

## 2019-10-04 DIAGNOSIS — M9903 Segmental and somatic dysfunction of lumbar region: Secondary | ICD-10-CM | POA: Diagnosis not present

## 2019-10-17 DIAGNOSIS — M9905 Segmental and somatic dysfunction of pelvic region: Secondary | ICD-10-CM | POA: Diagnosis not present

## 2019-10-17 DIAGNOSIS — M545 Low back pain: Secondary | ICD-10-CM | POA: Diagnosis not present

## 2019-10-17 DIAGNOSIS — M6019 Interstitial myositis, multiple sites: Secondary | ICD-10-CM | POA: Diagnosis not present

## 2019-10-17 DIAGNOSIS — M9903 Segmental and somatic dysfunction of lumbar region: Secondary | ICD-10-CM | POA: Diagnosis not present

## 2019-11-04 DIAGNOSIS — M9905 Segmental and somatic dysfunction of pelvic region: Secondary | ICD-10-CM | POA: Diagnosis not present

## 2019-11-04 DIAGNOSIS — M9902 Segmental and somatic dysfunction of thoracic region: Secondary | ICD-10-CM | POA: Diagnosis not present

## 2019-11-04 DIAGNOSIS — M9904 Segmental and somatic dysfunction of sacral region: Secondary | ICD-10-CM | POA: Diagnosis not present

## 2019-11-04 DIAGNOSIS — M9903 Segmental and somatic dysfunction of lumbar region: Secondary | ICD-10-CM | POA: Diagnosis not present

## 2019-11-04 DIAGNOSIS — M545 Low back pain: Secondary | ICD-10-CM | POA: Diagnosis not present

## 2019-11-04 DIAGNOSIS — M6019 Interstitial myositis, multiple sites: Secondary | ICD-10-CM | POA: Diagnosis not present

## 2019-11-18 DIAGNOSIS — M6019 Interstitial myositis, multiple sites: Secondary | ICD-10-CM | POA: Diagnosis not present

## 2019-11-18 DIAGNOSIS — M545 Low back pain: Secondary | ICD-10-CM | POA: Diagnosis not present

## 2019-11-18 DIAGNOSIS — M9903 Segmental and somatic dysfunction of lumbar region: Secondary | ICD-10-CM | POA: Diagnosis not present

## 2019-11-18 DIAGNOSIS — M9905 Segmental and somatic dysfunction of pelvic region: Secondary | ICD-10-CM | POA: Diagnosis not present

## 2019-12-13 DIAGNOSIS — M6019 Interstitial myositis, multiple sites: Secondary | ICD-10-CM | POA: Diagnosis not present

## 2019-12-13 DIAGNOSIS — M9905 Segmental and somatic dysfunction of pelvic region: Secondary | ICD-10-CM | POA: Diagnosis not present

## 2019-12-13 DIAGNOSIS — M545 Low back pain: Secondary | ICD-10-CM | POA: Diagnosis not present

## 2019-12-13 DIAGNOSIS — M9903 Segmental and somatic dysfunction of lumbar region: Secondary | ICD-10-CM | POA: Diagnosis not present

## 2019-12-18 IMAGING — DX DG LUMBAR SPINE 2-3V
2 series · 2 of 2 positions shown · non-contrast
Comparison: None.

CLINICAL DATA: 57-year-old female with right-sided lower back pain
for 10 days. No injury. Initial encounter.

EXAM:
LUMBAR SPINE - 2-3 VIEW

[l-spine ap]
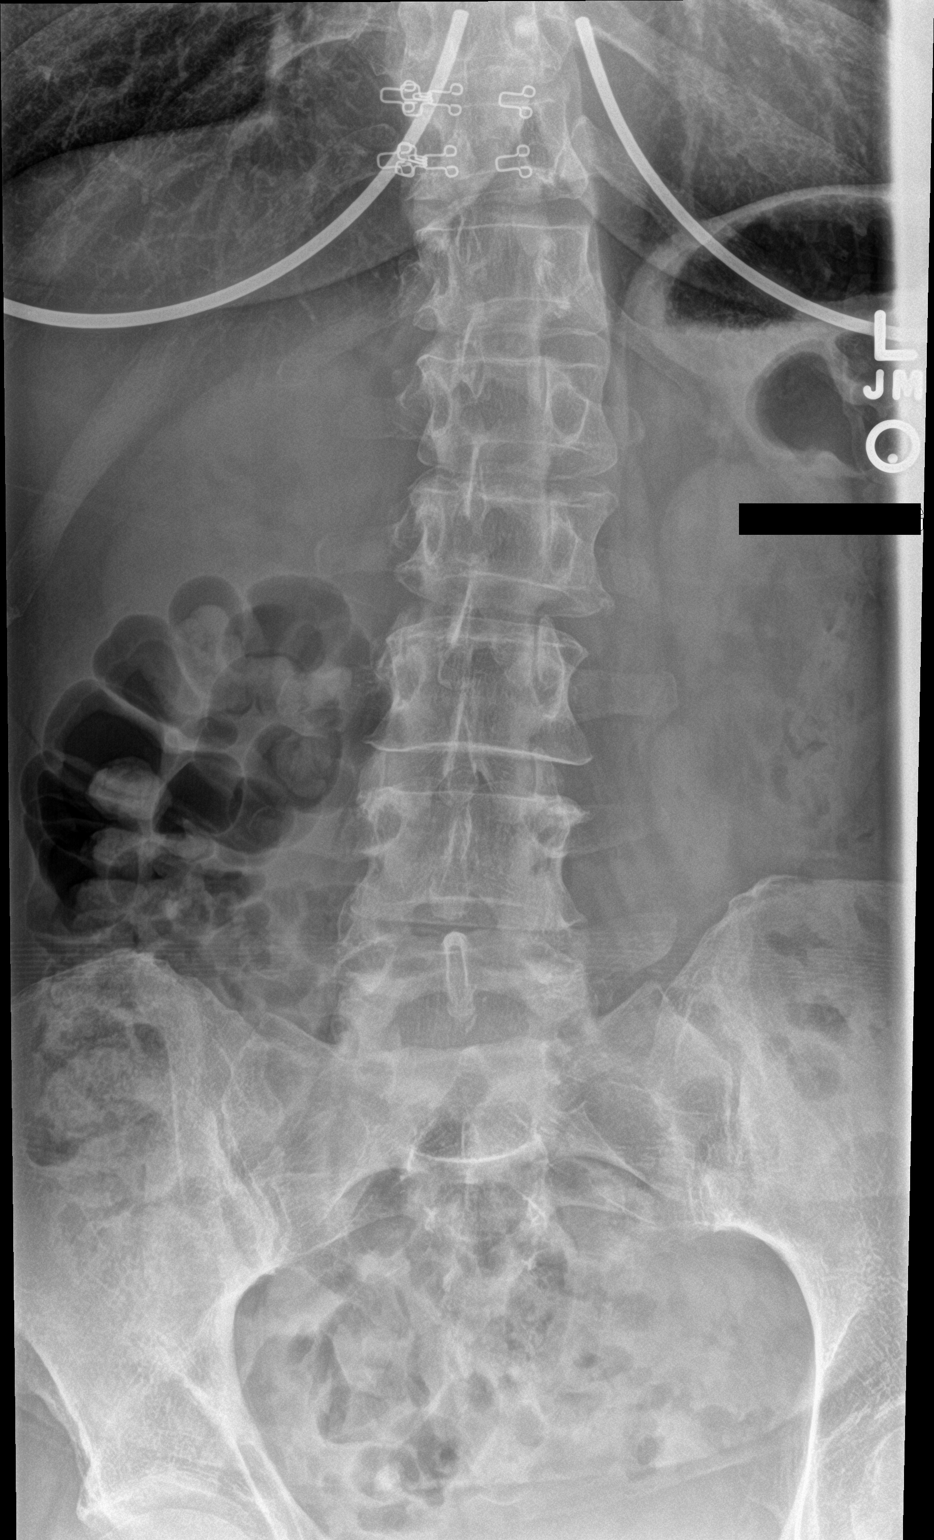

[l-spine lat]
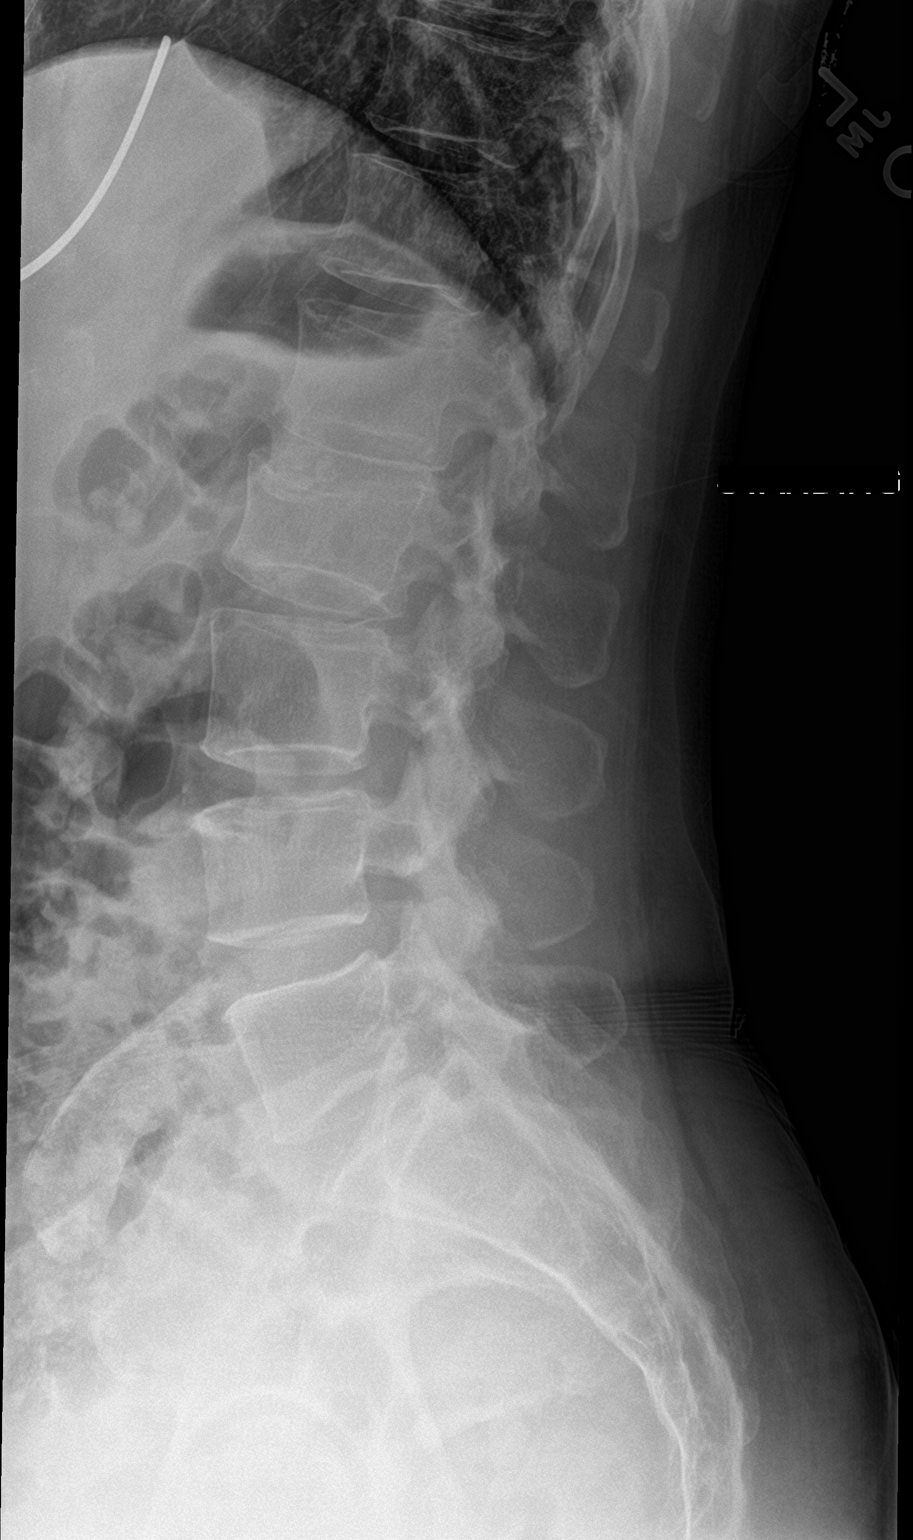

[2 of 2 positions shown; findings below may reference images not displayed]

FINDINGS: Mild scoliosis lower thoracic and upper lumbar spine convex left.

L1 and L2 superior endplate mild loss of height. This appears
chronic by plain film exam. If further delineation is clinically
desired MR Narik be considered.

Minimal right-sided L1-2 disc space narrowing. Minimal L2-3
left-sided disc space narrowing. No significant L3-4, L4-5 and L5-S1
disc space narrowing.
IMPRESSION: 1. Mild scoliosis lower thoracic and upper lumbar spine convex left.
2. L1 and L2 superior endplate mild loss of height. This appears
chronic by plain film exam. If further delineation is clinically
desired MR Narik be considered.
3. Minimal right-sided L1-2 disc space narrowing. Minimal L2-3
left-sided disc space narrowing.

## 2020-01-01 DIAGNOSIS — M9905 Segmental and somatic dysfunction of pelvic region: Secondary | ICD-10-CM | POA: Diagnosis not present

## 2020-01-01 DIAGNOSIS — M9903 Segmental and somatic dysfunction of lumbar region: Secondary | ICD-10-CM | POA: Diagnosis not present

## 2020-01-01 DIAGNOSIS — M545 Low back pain: Secondary | ICD-10-CM | POA: Diagnosis not present

## 2020-01-01 DIAGNOSIS — M6019 Interstitial myositis, multiple sites: Secondary | ICD-10-CM | POA: Diagnosis not present

## 2020-01-22 DIAGNOSIS — M6019 Interstitial myositis, multiple sites: Secondary | ICD-10-CM | POA: Diagnosis not present

## 2020-01-22 DIAGNOSIS — M9905 Segmental and somatic dysfunction of pelvic region: Secondary | ICD-10-CM | POA: Diagnosis not present

## 2020-01-22 DIAGNOSIS — M545 Low back pain: Secondary | ICD-10-CM | POA: Diagnosis not present

## 2020-01-22 DIAGNOSIS — M9903 Segmental and somatic dysfunction of lumbar region: Secondary | ICD-10-CM | POA: Diagnosis not present

## 2020-01-28 ENCOUNTER — Encounter: Payer: Self-pay | Admitting: Osteopathic Medicine

## 2020-02-26 DIAGNOSIS — M5451 Vertebrogenic low back pain: Secondary | ICD-10-CM | POA: Diagnosis not present

## 2020-02-26 DIAGNOSIS — M9905 Segmental and somatic dysfunction of pelvic region: Secondary | ICD-10-CM | POA: Diagnosis not present

## 2020-02-26 DIAGNOSIS — M9903 Segmental and somatic dysfunction of lumbar region: Secondary | ICD-10-CM | POA: Diagnosis not present

## 2020-02-26 DIAGNOSIS — M6019 Interstitial myositis, multiple sites: Secondary | ICD-10-CM | POA: Diagnosis not present

## 2020-03-16 DIAGNOSIS — Z23 Encounter for immunization: Secondary | ICD-10-CM | POA: Diagnosis not present

## 2020-03-30 DIAGNOSIS — M5451 Vertebrogenic low back pain: Secondary | ICD-10-CM | POA: Diagnosis not present

## 2020-03-30 DIAGNOSIS — M6019 Interstitial myositis, multiple sites: Secondary | ICD-10-CM | POA: Diagnosis not present

## 2020-03-30 DIAGNOSIS — Z1231 Encounter for screening mammogram for malignant neoplasm of breast: Secondary | ICD-10-CM | POA: Diagnosis not present

## 2020-03-30 DIAGNOSIS — M9905 Segmental and somatic dysfunction of pelvic region: Secondary | ICD-10-CM | POA: Diagnosis not present

## 2020-03-30 DIAGNOSIS — M9903 Segmental and somatic dysfunction of lumbar region: Secondary | ICD-10-CM | POA: Diagnosis not present
